# Patient Record
Sex: Male | Born: 2000 | Race: White | Hispanic: No | Marital: Single | Smoking: Current some day smoker
Health system: Southern US, Community
[De-identification: ages and names within clinical notes are randomized; demographics above are authoritative.]

## PROBLEM LIST (undated history)

## (undated) DIAGNOSIS — K5010 Crohn's disease of large intestine without complications: Secondary | ICD-10-CM

## (undated) DIAGNOSIS — K589 Irritable bowel syndrome without diarrhea: Secondary | ICD-10-CM

## (undated) DIAGNOSIS — R109 Unspecified abdominal pain: Secondary | ICD-10-CM

## (undated) HISTORY — DX: Unspecified abdominal pain: R10.9

---
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
## 2019-09-06 ENCOUNTER — Emergency Department
Admission: EM | Admit: 2019-09-06 | Discharge: 2019-09-06 | Disposition: A | Discharge status: Home / Self Care | Payer: BC Managed Care – PPO | Attending: Emergency Medicine | Admitting: Emergency Medicine

## 2019-09-06 ENCOUNTER — Encounter: Payer: Self-pay | Admitting: Emergency Medicine

## 2019-09-06 ENCOUNTER — Emergency Department: Payer: BC Managed Care – PPO

## 2019-09-06 ENCOUNTER — Other Ambulatory Visit: Payer: Self-pay

## 2019-09-06 DIAGNOSIS — W2209XA Striking against other stationary object, initial encounter: Secondary | ICD-10-CM | POA: Insufficient documentation

## 2019-09-06 DIAGNOSIS — Y9389 Activity, other specified: Secondary | ICD-10-CM | POA: Diagnosis not present

## 2019-09-06 DIAGNOSIS — Y929 Unspecified place or not applicable: Secondary | ICD-10-CM | POA: Insufficient documentation

## 2019-09-06 DIAGNOSIS — Y999 Unspecified external cause status: Secondary | ICD-10-CM | POA: Insufficient documentation

## 2019-09-06 DIAGNOSIS — Z79899 Other long term (current) drug therapy: Secondary | ICD-10-CM | POA: Diagnosis not present

## 2019-09-06 DIAGNOSIS — S61411A Laceration without foreign body of right hand, initial encounter: Secondary | ICD-10-CM | POA: Diagnosis not present

## 2019-09-06 MED ORDER — BACITRACIN ZINC 500 UNIT/GM EX OINT
TOPICAL_OINTMENT | Freq: Once | CUTANEOUS | Status: AC
  Administered 2019-09-06: 1 via TOPICAL
  Filled 2019-09-06: qty 0.9

## 2019-09-06 MED ORDER — LIDOCAINE HCL (PF) 1 % IJ SOLN
INTRAMUSCULAR | Status: AC
  Filled 2019-09-06: qty 5

## 2019-09-06 NOTE — ED Provider Notes (Signed)
[facility] Emergency Department Provider Note   ____________________________________________   First MD Initiated Contact with Patient 09/06/19 [phone]     (approximate)  I have reviewed the triage vital signs and the nursing notes.   HISTORY  Chief Complaint Laceration    HPI Devin Clayton is a 18 y.o. male who presents to the ED from home with right hand laceration status post punching a TV with his dominant right hand.  States he was not angry, just "did something stupid".  Tetanus is up-to-date.  Presents with multiple abrasions and laceration to his dorsal right hand.  Denies other complaints or injuries.       Past Medical History:  Diagnosis Date  . Abdominal pain     Patient Active Problem List   Diagnosis Date Noted  . 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 Diarrhea 10/27/2013  . History of gastroesophageal reflux (GERD) 10/27/2013  . History of pyelonephritis 10/27/2013  . Generalized abdominal pain     History reviewed. No pertinent surgical history.  Prior to Admission medications   Medication Sig Start Date End Date Taking? Authorizing Provider  Inulin (FIBERCHOICE) 2 G CHEW Chew 1 tablet (2 g total) by mouth daily. 11/12/13 11/12/14  Oletha Blend, MD  loperamide (IMODIUM A-D) 2 MG tablet Take 1 tablet (2 mg total) by mouth as needed for diarrhea or loose stools. 11/12/13   Oletha Blend, MD  pantoprazole (PROTONIX) 40 MG tablet Take 40 mg by mouth daily.    [provider]  sertraline (ZOLOFT) 50 MG tablet Take 50 mg by mouth daily.    [provider]    Allergies Azithromycin  Family History  Problem Relation Age of Onset  . Hiatal hernia Father   . Hiatal hernia Paternal Grandmother   . Celiac disease Neg Hx   . Cholelithiasis Neg Hx   . Irritable bowel syndrome Neg Hx     Social History Social History   Tobacco Use  . Smoking status: Never Smoker  . Smokeless tobacco: Never Used  Substance Use  Topics  . Alcohol use: Not on file  . Drug use: Not on file    Review of Systems  Constitutional: No fever/chills Eyes: No visual changes. ENT: No sore throat. Cardiovascular: Denies chest pain. Respiratory: Denies shortness of breath. Gastrointestinal: No abdominal pain.  No nausea, no vomiting.  No diarrhea.  No constipation. Genitourinary: Negative for dysuria. Musculoskeletal: Positive for right hand laceration.  Negative for back pain. Skin: Negative for rash. Neurological: Negative for headaches, focal weakness or numbness.   ____________________________________________   PHYSICAL EXAM:  VITAL SIGNS: ED Triage Vitals  Enc Vitals Group     BP 09/06/19 0430 (!) 142/79     Pulse Rate 09/06/19 0430 78     Resp 09/06/19 0430 18     Temp 09/06/19 0430 98.6 F (37 C)     Temp Source 09/06/19 0430 Oral     SpO2 09/06/19 0430 99 %     Weight --      Height --      Head Circumference --      Peak Flow --      Pain Score 09/06/19 0436 1     Pain Loc --      Pain Edu? --      Excl. in [place]? --     Constitutional: Alert and oriented. Well appearing and in no acute distress. Eyes: Conjunctivae are normal. PERRL. EOMI. Head: Atraumatic. Nose: No  congestion/rhinnorhea. Mouth/Throat: Mucous membranes are moist.  Oropharynx non-erythematous. Neck: No stridor.   Cardiovascular: Normal rate, regular rhythm. Grossly normal heart sounds.  Good peripheral circulation. Respiratory: Normal respiratory effort.  No retractions. Lungs CTAB. Gastrointestinal: Soft and nontender. No distention. No abdominal bruits. No CVA tenderness. Musculoskeletal:  Approximately 2 cm linear and superficial laceration to dorsum of right hand between second and third digits.  No active bleeding.  Scattered abrasions. No lower extremity tenderness nor edema.  No joint effusions. Neurologic:  Normal speech and language. No gross focal neurologic deficits are appreciated. No gait instability. Skin:  Skin  is warm, dry and intact. No rash noted. Psychiatric: Mood and affect are normal. Speech and behavior are normal.  ____________________________________________   LABS (all labs ordered are listed, but only abnormal results are displayed)  Labs Reviewed - No data to display ____________________________________________  EKG  None ____________________________________________  RADIOLOGY  ED MD interpretation: No FB  Official radiology report(s): DG Hand Complete Right  Result Date: 09/06/2019 CLINICAL DATA:  Lacerations after punching a television EXAM: RIGHT HAND - COMPLETE 3+ VIEW COMPARISON:  None. FINDINGS: There is no evidence of fracture or dislocation. There is no evidence of arthropathy or other focal bone abnormality. Soft tissues are unremarkable. No radiopaque foreign body. IMPRESSION: Negative. Electronically Signed   By: Deatra RobinsonKevin  Herman M.D.   On: 09/06/2019 05:10    ____________________________________________   PROCEDURES  Procedure(s) performed (including Critical Care):  Marland Kitchen.Marland Kitchen.Laceration Repair  Date/Time: 09/06/2019 5:45 AM Performed by: Irean HongSung, Rosenda Geffrard J, MD Authorized by: Irean HongSung, Hyacinth Marcelli J, MD   Consent:    Consent obtained:  Verbal   Consent given by:  Patient   Risks discussed:  Infection, pain, retained foreign body, poor cosmetic result and poor wound healing Anesthesia (see MAR for exact dosages):    Anesthesia method:  Local infiltration   Local anesthetic:  Lidocaine 1% w/o epi Laceration details:    Location:  Hand   Hand location:  R hand, dorsum   Length (cm):  2   Depth (mm):  2 Repair type:    Repair type:  Simple Pre-procedure details:    Preparation:  Patient was prepped and draped in usual sterile fashion Exploration:    Hemostasis achieved with:  Direct pressure   Wound exploration: entire depth of wound probed and visualized     Contaminated: no   Treatment:    Area cleansed with:  Saline   Amount of cleaning:  Standard   Irrigation  solution:  Sterile saline   Visualized foreign bodies/material removed: no   Skin repair:    Repair method:  Sutures   Suture size:  4-0   Suture material:  Nylon   Suture technique:  Running locked   Number of sutures: continuous. Approximation:    Approximation:  Close Post-procedure details:    Dressing:  Sterile dressing   Patient tolerance of procedure:  Tolerated well, no immediate complications     ____________________________________________   INITIAL IMPRESSION / ASSESSMENT AND PLAN / ED COURSE  As part of my medical decision making, I reviewed the following data within the electronic MEDICAL RECORD NUMBER Nursing notes reviewed and incorporated, Radiograph reviewed and Notes from prior ED visits     Leona CarryGabriel D Haynie was evaluated in Emergency Department on 09/06/2019 for the symptoms described in the history of present illness. He was evaluated in the context of the global COVID-19 pandemic, which necessitated consideration that the patient might be at risk for infection with the SARS-CoV-2  virus that causes COVID-19. Institutional protocols and algorithms that pertain to the evaluation of patients at risk for COVID-19 are in a state of rapid change based on information released by regulatory bodies including the CDC and federal and state organizations. These policies and algorithms were followed during the patient's care in the ED.    18 year old male who presents with right hand laceration status post punching TV.  No foreign body noted on x-ray.  Patient tolerated suture repair well.  Strict return precautions given.  Patient verbalizes understanding and agrees with plan of care.      ____________________________________________   FINAL CLINICAL IMPRESSION(S) / ED DIAGNOSES  Final diagnoses:  Laceration of right hand without foreign body, initial encounter     ED Discharge Orders    None       Note:  This document was prepared using Dragon voice recognition  software and may include unintentional dictation errors.   Irean Hong, MD 09/06/19 [phone]

## 2019-09-06 NOTE — ED Triage Notes (Signed)
Pt states he punched a tv and has multiple small lacs to his right hand, one larger cut as well. Is able to move his fingers. NAD.

## 2019-09-06 NOTE — ED Notes (Signed)
Bulky gauze dressing applied to right hand. Pt instructed on care of sutures and wound.

## 2019-09-06 NOTE — Discharge Instructions (Signed)
1.  Suture removal in 7 to 10 days. °2.  Return to the ER for worsening symptoms, increased redness/swelling, purulent discharge or other concerns. °

## 2020-01-13 IMAGING — DX DG HAND COMPLETE 3+V*R*
3 series · 3 of 3 positions shown · non-contrast
Comparison: None.

CLINICAL DATA: Lacerations after punching a television

EXAM:
RIGHT HAND - COMPLETE 3+ VIEW

[hand ap]
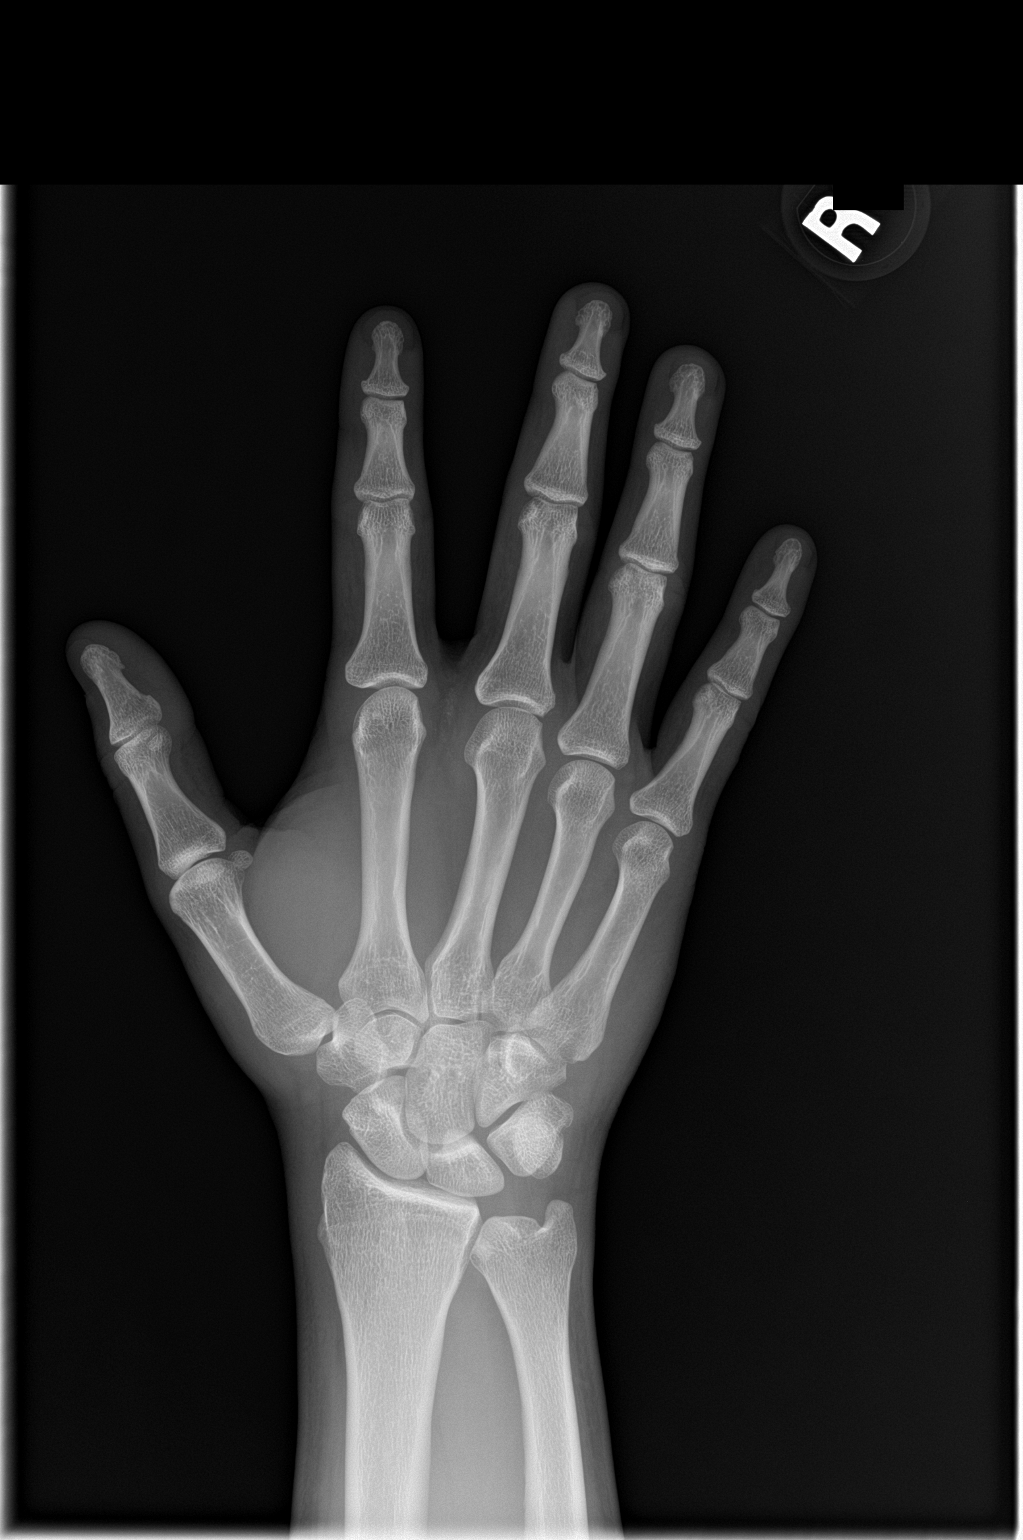

[hand obl]
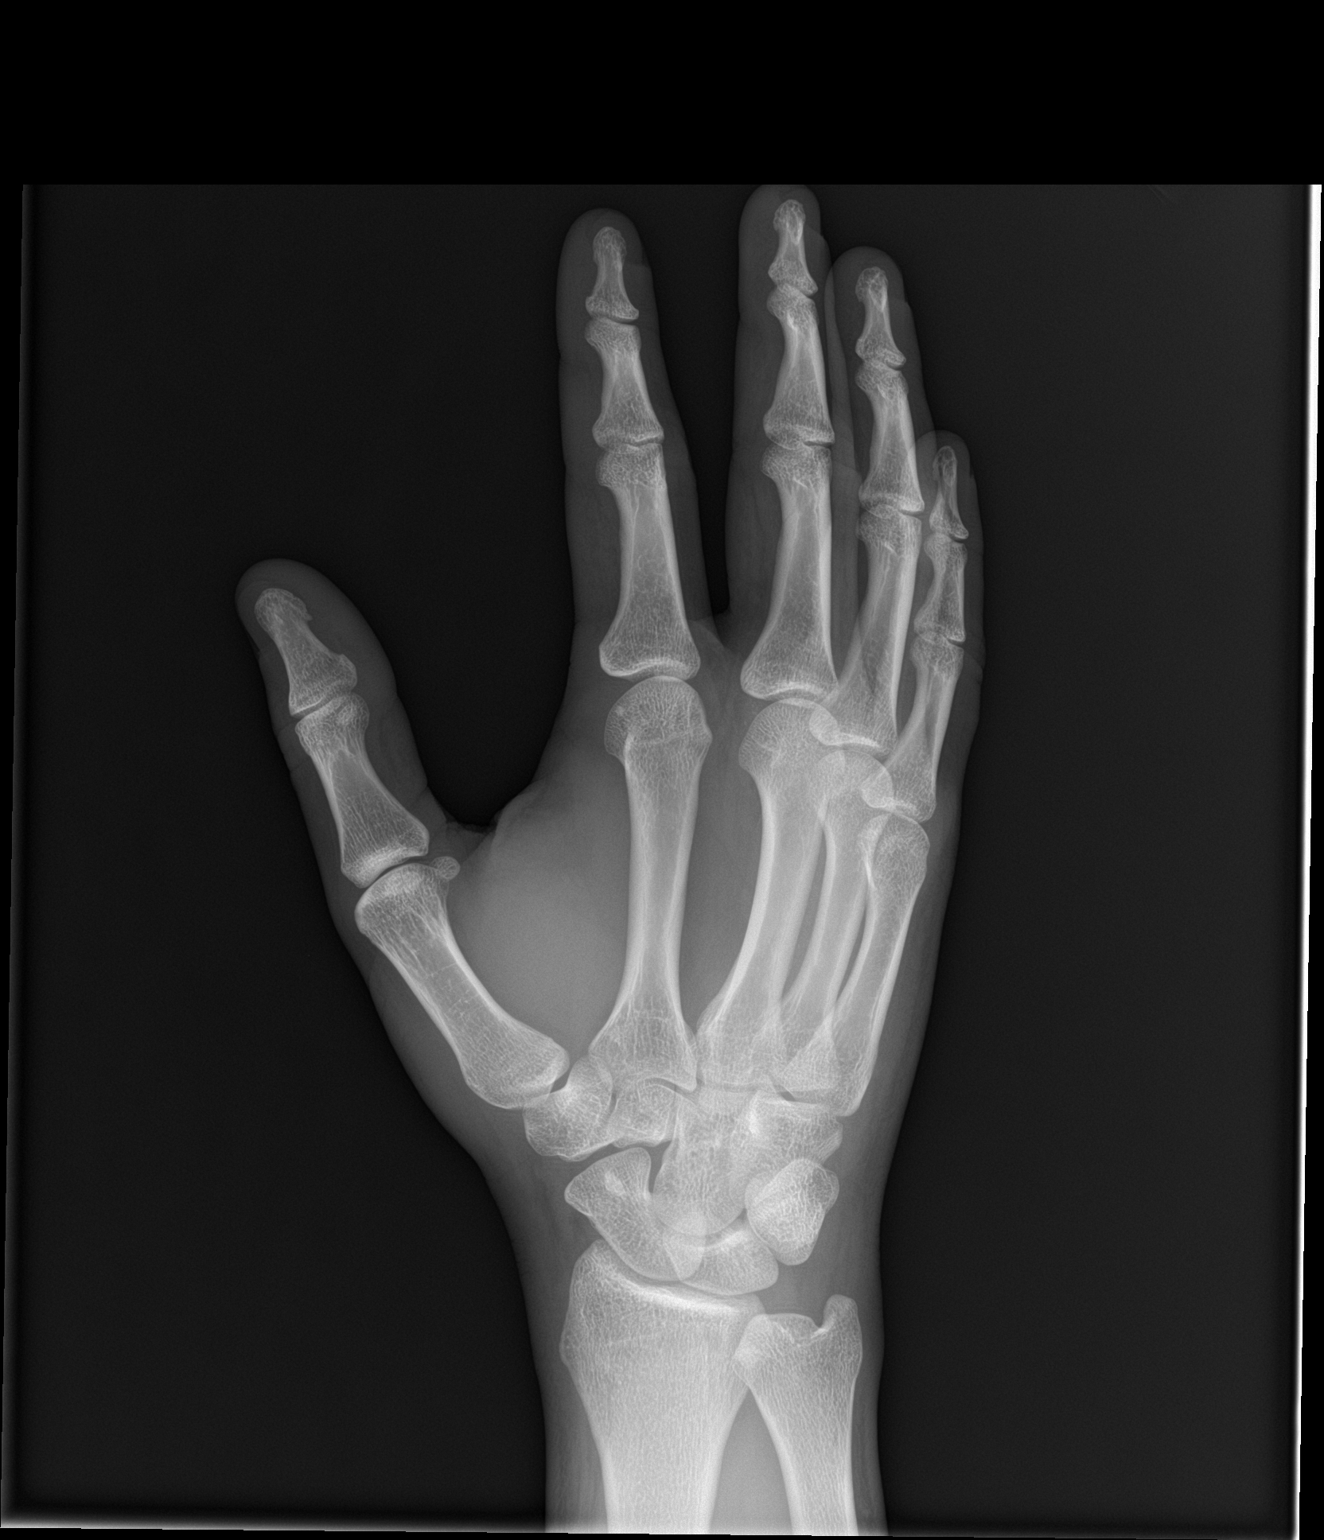

[hand lat]
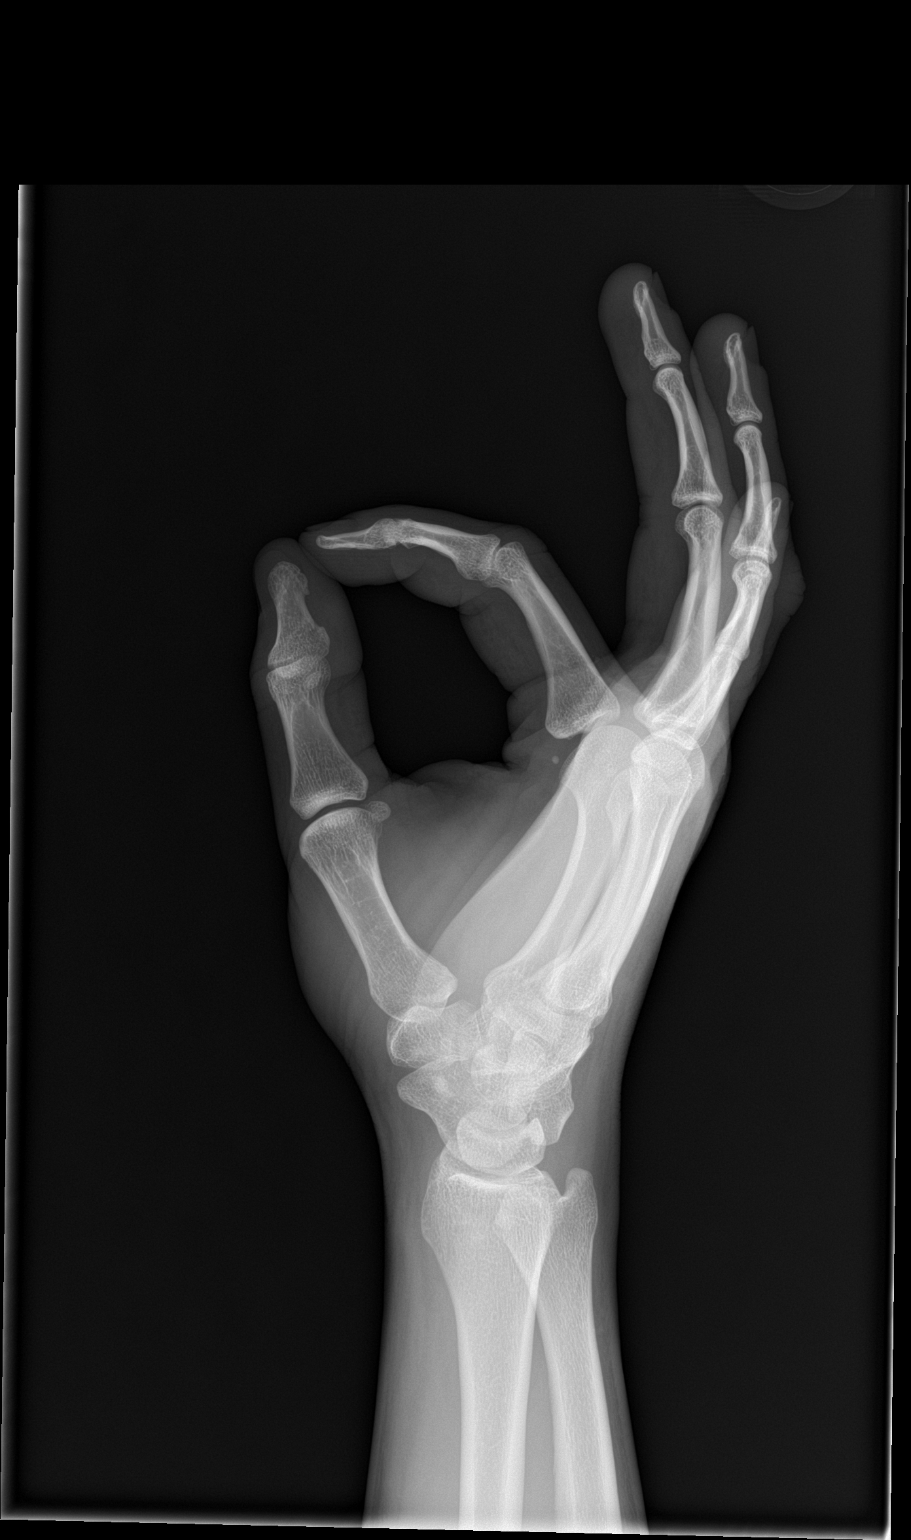

[3 of 3 positions shown; findings below may reference images not displayed]

FINDINGS: There is no evidence of fracture or dislocation. There is no
evidence of arthropathy or other focal bone abnormality. Soft
tissues are unremarkable. No radiopaque foreign body.
IMPRESSION: Negative.

## 2024-02-27 ENCOUNTER — Emergency Department
Admission: EM | Admit: 2024-02-27 | Discharge: 2024-02-27 | Disposition: A | Discharge status: Home / Self Care | Payer: Self-pay | Attending: Emergency Medicine | Admitting: Emergency Medicine

## 2024-02-27 ENCOUNTER — Emergency Department: Payer: Self-pay

## 2024-02-27 DIAGNOSIS — N50812 Left testicular pain: Secondary | ICD-10-CM | POA: Insufficient documentation

## 2024-02-27 DIAGNOSIS — R1032 Left lower quadrant pain: Secondary | ICD-10-CM | POA: Diagnosis not present

## 2024-02-27 DIAGNOSIS — R1030 Lower abdominal pain, unspecified: Secondary | ICD-10-CM | POA: Diagnosis present

## 2024-02-27 DIAGNOSIS — N50811 Right testicular pain: Secondary | ICD-10-CM | POA: Diagnosis not present

## 2024-02-27 DIAGNOSIS — R300 Dysuria: Secondary | ICD-10-CM | POA: Diagnosis not present

## 2024-02-27 DIAGNOSIS — R1031 Right lower quadrant pain: Secondary | ICD-10-CM | POA: Insufficient documentation

## 2024-02-27 DIAGNOSIS — N201 Calculus of ureter: Secondary | ICD-10-CM

## 2024-02-27 HISTORY — DX: Irritable bowel syndrome, unspecified: K58.9

## 2024-02-27 HISTORY — DX: Crohn's disease of large intestine without complications: K50.10

## 2024-02-27 LAB — CBC WITH DIFFERENTIAL/PLATELET
Abs Immature Granulocytes: 0.05 10*3/uL (ref 0.00–0.07)
Basophils Absolute: 0 10*3/uL (ref 0.0–0.1)
Basophils Relative: 0 %
Eosinophils Absolute: 0.1 10*3/uL (ref 0.0–0.5)
Eosinophils Relative: 1 %
HCT: 39.9 % (ref 39.0–52.0)
Hemoglobin: 14.5 g/dL (ref 13.0–17.0)
Immature Granulocytes: 1 %
Lymphocytes Relative: 19 %
Lymphs Abs: 1.9 10*3/uL (ref 0.7–4.0)
MCH: 32.3 pg (ref 26.0–34.0)
MCHC: 36.3 g/dL — ABNORMAL HIGH (ref 30.0–36.0)
MCV: 88.9 fL (ref 80.0–100.0)
Monocytes Absolute: 1 10*3/uL (ref 0.1–1.0)
Monocytes Relative: 10 %
Neutro Abs: 7.2 10*3/uL (ref 1.7–7.7)
Neutrophils Relative %: 69 %
Platelets: 215 10*3/uL (ref 150–400)
RBC: 4.49 MIL/uL (ref 4.22–5.81)
RDW: 11.6 % (ref 11.5–15.5)
WBC: 10.3 10*3/uL (ref 4.0–10.5)
nRBC: 0 % (ref 0.0–0.2)

## 2024-02-27 LAB — CHLAMYDIA/NGC RT PCR (ARMC ONLY)
Chlamydia Tr: NOT DETECTED
N gonorrhoeae: NOT DETECTED

## 2024-02-27 LAB — COMPREHENSIVE METABOLIC PANEL WITH GFR
ALT: 14 U/L (ref 0–44)
AST: 35 U/L (ref 15–41)
Albumin: 4.6 g/dL (ref 3.5–5.0)
Alkaline Phosphatase: 57 U/L (ref 38–126)
Anion gap: 13 (ref 5–15)
BUN: 13 mg/dL (ref 6–20)
CO2: 20 mmol/L — ABNORMAL LOW (ref 22–32)
Calcium: 9.3 mg/dL (ref 8.9–10.3)
Chloride: 103 mmol/L (ref 98–111)
Creatinine, Ser: 1.2 mg/dL (ref 0.61–1.24)
GFR, Estimated: >60 mL/min (ref >60)
Glucose, Bld: 136 mg/dL — ABNORMAL HIGH (ref 70–99)
Potassium: 3.6 mmol/L (ref 3.5–5.1)
Sodium: 136 mmol/L (ref 135–145)
Total Bilirubin: 1.4 mg/dL — ABNORMAL HIGH (ref 0.0–1.2)
Total Protein: 7.5 g/dL (ref 6.5–8.1)

## 2024-02-27 LAB — URINALYSIS, W/ REFLEX TO CULTURE (INFECTION SUSPECTED)
Bacteria, UA: NONE SEEN
Bilirubin Urine: NEGATIVE
Glucose, UA: NEGATIVE mg/dL
Ketones, ur: NEGATIVE mg/dL
Leukocytes,Ua: NEGATIVE
Nitrite: NEGATIVE
Protein, ur: NEGATIVE mg/dL
RBC / HPF: >50 RBC/hpf (ref 0–5)
Specific Gravity, Urine: >1.046 — ABNORMAL HIGH (ref 1.005–1.030)
Squamous Epithelial / HPF: 0 /HPF (ref 0–5)
pH: 7 (ref 5.0–8.0)

## 2024-02-27 LAB — LIPASE, BLOOD: Lipase: 26 U/L (ref 11–51)

## 2024-02-27 MED ORDER — ONDANSETRON 4 MG PO TBDP
4.0000 mg | ORAL_TABLET | Freq: Three times a day (TID) | ORAL | 0 refills | Status: AC | PRN
Start: 2024-02-27 — End: ?

## 2024-02-27 MED ORDER — IBUPROFEN 800 MG PO TABS: 800.0000 mg | ORAL_TABLET | Freq: Three times a day (TID) | ORAL | 0 refills | Status: AC | PRN

## 2024-02-27 MED ORDER — DROPERIDOL 2.5 MG/ML IJ SOLN
2.5000 mg | Freq: Once | INTRAMUSCULAR | Status: AC
  Administered 2024-02-27: 2.5 mg via INTRAVENOUS
  Filled 2024-02-27: qty 2

## 2024-02-27 MED ORDER — ACETAMINOPHEN 500 MG PO TABS: 1000.0000 mg | ORAL_TABLET | Freq: Four times a day (QID) | ORAL | 2 refills | Status: AC | PRN

## 2024-02-27 MED ORDER — ONDANSETRON HCL 4 MG/2ML IJ SOLN
4.0000 mg | Freq: Once | INTRAMUSCULAR | Status: DC
  Filled 2024-02-27: qty 2

## 2024-02-27 MED ORDER — MORPHINE SULFATE (PF) 4 MG/ML IV SOLN
4.0000 mg | Freq: Once | INTRAVENOUS | Status: AC
  Administered 2024-02-27: 4 mg via INTRAVENOUS
  Filled 2024-02-27: qty 1

## 2024-02-27 MED ORDER — IOHEXOL 300 MG/ML  SOLN
100.0000 mL | Freq: Once | INTRAMUSCULAR | Status: AC | PRN
  Administered 2024-02-27: 100 mL via INTRAVENOUS

## 2024-02-27 MED ORDER — KETOROLAC TROMETHAMINE 30 MG/ML IJ SOLN
30.0000 mg | Freq: Once | INTRAMUSCULAR | Status: AC
  Administered 2024-02-27: 30 mg via INTRAVENOUS
  Filled 2024-02-27: qty 1

## 2024-02-27 MED ORDER — SODIUM CHLORIDE 0.9 % IV BOLUS
1000.0000 mL | Freq: Once | INTRAVENOUS | Status: AC
  Administered 2024-02-27: 1000 mL via INTRAVENOUS

## 2024-02-27 MED ORDER — ACETAMINOPHEN 325 MG PO TABS
650.0000 mg | ORAL_TABLET | Freq: Once | ORAL | Status: DC
  Filled 2024-02-27: qty 2

## 2024-02-27 MED ORDER — TAMSULOSIN HCL 0.4 MG PO CAPS: 0.4000 mg | ORAL_CAPSULE | Freq: Every day | ORAL | 0 refills | Status: AC

## 2024-02-27 MED ORDER — OXYCODONE HCL 5 MG PO TABS: 5.0000 mg | ORAL_TABLET | Freq: Three times a day (TID) | ORAL | 0 refills | Status: AC | PRN

## 2024-02-27 NOTE — ED Provider Notes (Signed)
  Physical Exam  BP 130/84   Pulse 68   Temp 97.9 F (36.6 C) (Oral)   Resp 18   SpO2 91%   Physical Exam  Procedures  Procedures  ED Course / MDM   Clinical Course as of 02/27/24 1017  Thu Feb 27, 2024  3295 S/o from Dr. Margery Sheets: 16M hx IBS, MJ use, anxiety p/w lower ab pain since 3 am, n/v, +testicular pain though overall reassuring exam  CT/US  pending  To do: - f/u imaging, urine - dispo [MM]  [phone] Patient reevaluated, feeling better after pain medications, no ongoing testicular discomfort, ultrasound with no evidence of torsion on my interpretation, formal read pending.  Still is not been able to provide urine sample.  Awaiting results of imaging [MM]  0855 CTAP: IMPRESSION: 1. Left-sided hydronephrosis, perinephric fluid and soft tissue stranding with hydroureter. 3 mm stone identified at the left UVJ. 2. Mild splenomegaly. 3. No signs of active inflammatory bowel disease.   [MM]  0855 US : IMPRESSION: Normal testicular ultrasound.      [MM]  [phone] Patient updated on CT findings, suspect urolithiasis as source of pain.  Will attempt to provide urine sample now. [MM]  1016 UA w/ hematuria but no e/o infxn Pain controlled here in emergency department  Will discharge with Flomax, pain control, antiemetics, urology referral.  Plan discussed with patient who was in agreement.  ED return precautions in place. [MM]    Clinical Course User Index [MM] Collis Deaner, MD   Medical Decision Making Amount and/or Complexity of Data Reviewed Labs: ordered. Radiology: ordered.  Risk OTC drugs. Prescription drug management.          Collis Deaner, MD 02/27/24 1017

## 2024-02-27 NOTE — ED Triage Notes (Signed)
 Per EMS [place] lower abd pain all night , 4 zofran given , radiates flanks to back , 20 G, hx crohns and IBS, AO, ambulated to bed from stretcher, pt reports difficulty with urination and "only peed once today and it was pink"

## 2024-02-27 NOTE — Discharge Instructions (Addendum)
 Your evaluation in the emergency department was notable for a kidney stone.  This is small enough that it should hopefully pass on its own.  I have placed a referral for you to follow-up with a urologist, and you should also follow-up with your primary care provider.  I prescribed you several pain medications as well as antinausea medication to use as needed.  Return to the emergency department with any new or worsening symptoms including inability to urinate, uncontrollable pain or vomiting, or fever.

## 2024-02-27 NOTE — ED Provider Notes (Signed)
 [facility] Provider Note    Event Date/Time   First MD Initiated Contact with Patient 02/27/24 [phone]     (approximate)   History   Abdominal Pain   HPI  Devin Clayton is a 23 y.o. male   Past medical history of IBS, anxiety, depression, ADHD, GERD, marijuana use and occasional alcohol drinker, and a self-reported history of Crohn's disease though no medical chart indication of this on medical chart review, who presents to the emergency department with nausea vomiting and lower abdominal cramping pain starting last evening.  Bowel movements have been normal, no reported diarrhea or GI bleeding.  No blood in the emesis.  He also reports dysuria and pink-tinged urine.  No fevers no chills.  No penile discharge.  No history of abdominal surgeries.   External Medical Documents Reviewed: Several outpatient visits reviewed over the last several years including family medicine, psychiatry, internal medicine to review past medical history as above      Physical Exam   Triage Vital Signs: ED Triage Vitals [02/27/24 0619]  Encounter Vitals Group     BP (!) 158/108     Systolic BP Percentile      Diastolic BP Percentile      Pulse Rate 73     Resp (!) 25     Temp 97.9 F (36.6 C)     Temp Source Oral     SpO2 100 %     Weight      Height      Head Circumference      Peak Flow      Pain Score 10     Pain Loc      Pain Education      Exclude from Growth Chart     Most recent vital signs: Vitals:   02/27/24 0619 02/27/24 0626  BP: (!) 158/108 (!) 142/76  Pulse: 73 81  Resp: (!) 25 (!) 21  Temp: 97.9 F (36.6 C)   SpO2: 100% 100%    General: Awake, no distress.  CV:  Good peripheral perfusion.  Resp:  Normal effort. Abd:  No distention. Other:  Has tenderness to bilateral lower quadrants.  There is no rigidity or guarding.  He is retching and vomiting stomach contents, nonbilious nonbloody.  Assessment of the scrotum/testicle showed  no skin changes, normal lie and no significant swelling though he is tender at the bilateral testes more so on the left.   ED Results / Procedures / Treatments   Labs (all labs ordered are listed, but only abnormal results are displayed) Labs Reviewed  COMPREHENSIVE METABOLIC PANEL WITH GFR - Abnormal; Notable for the following components:      Result Value   CO2 20 (*)    Glucose, Bld 136 (*)    Total Bilirubin 1.4 (*)    All other components within normal limits  CBC WITH DIFFERENTIAL/PLATELET - Abnormal; Notable for the following components:   MCHC 36.3 (*)    All other components within normal limits  CHLAMYDIA/NGC RT PCR ([facility] ONLY)            LIPASE, BLOOD  URINALYSIS, W/ REFLEX TO CULTURE (INFECTION SUSPECTED)     I ordered and reviewed the above labs they are notable for he has got a white blood cell count of 10.3.  H&H within normal limit.   MEDICATIONS ORDERED IN ED: Medications  acetaminophen (TYLENOL) tablet 650 mg ( Oral Canceled Entry 02/27/24 4132)  morphine (PF) 4 MG/ML injection  4 mg (has no administration in time range)  sodium chloride 0.9 % bolus 1,000 mL (1,000 mLs Intravenous New Bag/Given 02/27/24 1610)  morphine (PF) 4 MG/ML injection 4 mg (4 mg Intravenous Given 02/27/24 0640)  droperidol (INAPSINE) 2.5 MG/ML injection 2.5 mg (2.5 mg Intravenous Given 02/27/24 0645)     IMPRESSION / MDM / ASSESSMENT AND PLAN / ED COURSE  I reviewed the triage vital signs and the nursing notes.                                Patient's presentation is most consistent with acute presentation with potential threat to life or bodily function.  Differential diagnosis includes, but is not limited to, testicular torsion, STI/orchitis/epididymitis, urinary tract infection, appendicitis, intra-abdominal infection, obstruction, IBS flare   The patient is on the cardiac monitor to evaluate for evidence of arrhythmia and/or significant heart rate changes.  MDM:    He looks  quite uncomfortable retching and vomiting with abdominal pain.  Has tenderness to the bilateral lower quadrants as well as to the testicles bilaterally.  I wonder if he has an intra-abdominal infection or testicular torsion or epididymitis/orchitis and so I will check for all of these possibilities with testicular ultrasound, STI and UTI testing, as well as a CT abdomen and pelvis.  I will give him antiemetic and pain medications via IV.  Disposition pending on the results of evaluation above and reassessment after treatment.      FINAL CLINICAL IMPRESSION(S) / ED DIAGNOSES   Final diagnoses:  Lower abdominal pain  Dysuria  Pain in both testicles     Rx / DC Orders   ED Discharge Orders     None        Note:  This document was prepared using Dragon voice recognition software and may include unintentional dictation errors.    Buell Carmin, MD 02/27/24 [phone]
